# Patient Record
Sex: Male | Born: 1970
Health system: Southern US, Community
[De-identification: ages and names within clinical notes are randomized; demographics above are authoritative.]

## PROBLEM LIST (undated history)

## (undated) DIAGNOSIS — I10 Essential (primary) hypertension: Secondary | ICD-10-CM

---

## 1998-08-24 ENCOUNTER — Emergency Department (HOSPITAL_COMMUNITY): Admission: EM | Admit: 1998-08-24 | Discharge: 1998-08-24 | Payer: Self-pay | Admitting: Emergency Medicine

## 2000-10-02 ENCOUNTER — Encounter: Payer: Self-pay | Admitting: Cardiology

## 2000-10-02 ENCOUNTER — Ambulatory Visit (HOSPITAL_COMMUNITY): Admission: RE | Admit: 2000-10-02 | Discharge: 2000-10-02 | Payer: Self-pay | Admitting: Cardiology

## 2003-01-06 ENCOUNTER — Emergency Department (HOSPITAL_COMMUNITY): Admission: EM | Admit: 2003-01-06 | Discharge: 2003-01-06 | Payer: Self-pay | Admitting: Emergency Medicine

## 2003-01-08 ENCOUNTER — Encounter: Payer: Self-pay | Admitting: Emergency Medicine

## 2003-01-08 ENCOUNTER — Emergency Department (HOSPITAL_COMMUNITY): Admission: EM | Admit: 2003-01-08 | Discharge: 2003-01-08 | Payer: Self-pay | Admitting: Emergency Medicine

## 2003-01-15 ENCOUNTER — Emergency Department (HOSPITAL_COMMUNITY): Admission: EM | Admit: 2003-01-15 | Discharge: 2003-01-15 | Payer: Self-pay | Admitting: Emergency Medicine

## 2003-01-16 ENCOUNTER — Emergency Department (HOSPITAL_COMMUNITY): Admission: EM | Admit: 2003-01-16 | Discharge: 2003-01-16 | Payer: Self-pay | Admitting: *Deleted

## 2003-01-16 ENCOUNTER — Encounter: Payer: Self-pay | Admitting: Emergency Medicine

## 2003-12-28 ENCOUNTER — Emergency Department (HOSPITAL_COMMUNITY): Admission: EM | Admit: 2003-12-28 | Discharge: 2003-12-28 | Payer: Self-pay | Admitting: Family Medicine

## 2005-01-04 ENCOUNTER — Emergency Department (HOSPITAL_COMMUNITY): Admission: EM | Admit: 2005-01-04 | Discharge: 2005-01-04 | Payer: Self-pay | Admitting: Emergency Medicine

## 2005-01-05 ENCOUNTER — Emergency Department (HOSPITAL_COMMUNITY): Admission: EM | Admit: 2005-01-05 | Discharge: 2005-01-05 | Payer: Self-pay | Admitting: Emergency Medicine

## 2005-01-05 ENCOUNTER — Emergency Department (HOSPITAL_COMMUNITY): Admission: EM | Admit: 2005-01-05 | Discharge: 2005-01-06 | Payer: Self-pay | Admitting: Emergency Medicine

## 2005-01-07 ENCOUNTER — Inpatient Hospital Stay (HOSPITAL_COMMUNITY): Admission: EM | Admit: 2005-01-07 | Discharge: 2005-01-08 | Payer: Self-pay | Admitting: Emergency Medicine

## 2005-08-25 ENCOUNTER — Emergency Department (HOSPITAL_COMMUNITY): Admission: EM | Admit: 2005-08-25 | Discharge: 2005-08-25 | Payer: Self-pay | Admitting: *Deleted

## 2006-01-08 ENCOUNTER — Emergency Department (HOSPITAL_COMMUNITY): Admission: EM | Admit: 2006-01-08 | Discharge: 2006-01-08 | Payer: Self-pay | Admitting: Emergency Medicine

## 2007-01-31 ENCOUNTER — Emergency Department (HOSPITAL_COMMUNITY): Admission: EM | Admit: 2007-01-31 | Discharge: 2007-01-31 | Payer: Self-pay | Admitting: Emergency Medicine

## 2007-02-01 ENCOUNTER — Emergency Department (HOSPITAL_COMMUNITY): Admission: EM | Admit: 2007-02-01 | Discharge: 2007-02-01 | Payer: Self-pay | Admitting: Emergency Medicine

## 2009-11-02 ENCOUNTER — Emergency Department (HOSPITAL_COMMUNITY): Admission: EM | Admit: 2009-11-02 | Discharge: 2009-11-02 | Payer: Self-pay | Admitting: Family Medicine

## 2010-09-05 ENCOUNTER — Emergency Department (HOSPITAL_COMMUNITY)
Admission: EM | Admit: 2010-09-05 | Discharge: 2010-09-05 | Disposition: A | Discharge status: Home / Self Care | Payer: Worker's Compensation | Attending: Emergency Medicine | Admitting: Emergency Medicine

## 2010-09-05 DIAGNOSIS — R209 Unspecified disturbances of skin sensation: Secondary | ICD-10-CM | POA: Insufficient documentation

## 2010-09-05 DIAGNOSIS — M545 Low back pain, unspecified: Secondary | ICD-10-CM | POA: Insufficient documentation

## 2010-09-05 DIAGNOSIS — G8929 Other chronic pain: Secondary | ICD-10-CM | POA: Insufficient documentation

## 2010-09-05 DIAGNOSIS — I10 Essential (primary) hypertension: Secondary | ICD-10-CM | POA: Insufficient documentation

## 2010-11-13 NOTE — H&P (Signed)
NAMECHAUNCY, MANGIARACINA                   ACCOUNT NO.:  [id number]   MEDICAL RECORD NO.:  [id number]          PATIENT TYPE:  INP   LOCATION:  1823                         FACILITY:  [facility]   PHYSICIAN:  Marlan Palau, M.D.  DATE OF BIRTH:  11-16-1970   DATE OF ADMISSION:  01/07/2005  DATE OF DISCHARGE:                                HISTORY & PHYSICAL   HISTORY OF PRESENT ILLNESS:  Charles Preston is a 40 year old right-handed  black male born 07-25-1970 with a history of cluster migraine variant  seen previously by Dr. Vickey Huger at some point in the past.  Patient claims  his headaches began in 2000 and then recurred about once a year lasting  about two weeks in a cycle.  Headaches tend to occur on the left side of the  head, around the left eye, go to the left occipital area associated with  droopy eyelid, watery eye and nose.  Patient has severe boring pain in the  left periorbital area that is incapacitating.  Patient claims that his  headaches began about a week ago, may last about four or five hours in  duration and then let up.  Headaches may come on while sleeping in the  middle of the night.  Patient has frequented the emergency room over the  last four days and is unable to function.  Oxygen therapy has not helped.  Patient is being brought in for an evaluation of the above.   PAST MEDICAL HISTORY:  1.  History of cluster migraines as above.  2.  Hypertension, recently placed on hydrochlorothiazide 25 mg a day.   Patient is on no other medications, has no no known allergies.  Smokes about  two packs of cigarettes a week.  Drinks about a six-pack of beer a week.   SOCIAL HISTORY:  This patient is married.  Lives in the [place] area.  Has two children, two sons who are alive and well.  Patient works in the  [employer].   FAMILY HISTORY:  Notable that both parents are still alive and well.  Patient has a paternal grandmother with hypertension.  Has a maternal cousin  with  juvenile diabetes.  No family history of cancer is noted.   REVIEW OF SYSTEMS:  Notable for no recent fevers, chills.  Patient does note  the headache above.  Denies neck stiffness.  Denies chest pain.  Does have  occasional shortness of breath.  Does not nausea.  No vomiting.  Denies any  problems controlling the bowels or the bladder.  Has no numbness on the  face, arms, or legs.  No blackout episodes, syncope.  Has some slight gait  imbalance.   PHYSICAL EXAMINATION:  VITAL SIGNS:  Blood pressure is currently 151/84,  heart rate 101, respiratory rate 18, temperature afebrile.  GENERAL:  This patient is a minimally obese black male who is alert and  cooperative at the time of examination.  HEENT:  Head is atraumatic.  Eyes:  Pupils appear to be relatively equal,  round, and reactive but there is definite  ptosis on the left compared to the  right.  NECK:  Supple.  No carotid bruits noted.  RESPIRATORY:  Clear.  CARDIOVASCULAR:  Regular rate and rhythm.  No obvious murmurs, rubs noted.  EXTREMITIES:  Without significant edema.  ABDOMEN:  Positive bowel sounds.  No organomegaly or tenderness noted.  NEUROLOGIC:  Good pin prick sensation to face.  Full extraocular movements  are intact.  Visual fields are full.  Speech is well enunciated and not  aphasic.  Good strength to facial muscles and the muscles to head turning  and shoulder shrug were noted.  Motor testing revealed 5/5 strength in all  fours.  Good symmetric motor tone is noted throughout.  Sensory testing is  intact to pin prick, soft touch, vibratory sensation throughout.  Patient  has good finger-nose-finger, toe-to-finger bilaterally.  Gait was not  tested.  Deep tendon reflexes are symmetric, normal.  Toes downgoing  bilaterally.   LABORATORIES:  Chest x-ray, EKG are pending at the time of this dictation.  CT of the head was unremarkable.   IMPRESSION:  1.  Cluster headache or cluster migraine variant.  2.   Hypertension.   Patient is suffering severely with current headaches, is incapacitated the  last four days, has basically been in the emergency room most of that time.  Will admit this patient for evaluation and management of the headache   PLAN:  1.  Admission to [facility].  2.  Carotid Doppler study.  3.  DHE45 protocol.  4.  IV methylprednisolone.  5.  Analgesics for pain.  6.  Urine drug screen.  Will follow patient's clinical course while in-      house.  Patient may benefit from Lithium therapy upon discharge.      Fortunately, the cycles of headache tend to be fairly brief, lasting      only a couple weeks.       CKW/MEDQ  D:  01/07/2005  T:  01/07/2005  Job:  [id number]   cc:   Osvaldo Shipper. Spruill, M.D.  [address] [id number]  [place]  [place] [address]  Fax: [phone]

## 2010-11-13 NOTE — Discharge Summary (Signed)
Charles Preston, Charles Preston                   ACCOUNT NO.:  [id number]   MEDICAL RECORD NO.:  [id number]          PATIENT TYPE:  INP   LOCATION:  3041                         FACILITY:  [facility]   PHYSICIAN:  Marlan Palau, M.D.  DATE OF BIRTH:  01/22/1971   DATE OF ADMISSION:  01/07/2005  DATE OF DISCHARGE:  01/08/2005                                 DISCHARGE SUMMARY   ADMITTING DIAGNOSES:  1.  Cluster headache.  2.  Hypertension.   DISCHARGE DIAGNOSES:  1.  Cluster headache.  2.  Hypertension.   PROCEDURES:  1.  CT of the head.  No complications of above procedures noted.  2.  Carotid Doppler study also done.   HISTORY OF PRESENT ILLNESS:  Charles Preston is a 40 year old right-handed black  male born 05/14/1971 with a history of cluster headache and hypertension.  The patient has cycles of headaches that have been going on for about six  years, approximately once a year lasting anywhere from two to four weeks  with the cycle.  Episodes involve headaches around the left eye and  forehead.  Patient has had a severe headache for about a week, has been  frequenting the emergency room without much improvement.  Patient has been  seen by Dr. Vickey Huger in the past.  The patient was brought into the hospital  for DHE-45 protocol for treatment of cluster headache.  CT of the head was  performed and was unremarkable.   PAST MEDICAL HISTORY:  1.  Cluster migraine.  2.  Hypertension.   MEDICATIONS:  Hydrochlorothiazide 25 mg a day.   The patient has no known allergies.  Smokes two packs of cigarettes a week.  Drinks a six-pack of beer a week.   Please refer to history and physical dictation summary for social history,  family history, review of systems, physical examination.   HOSPITAL COURSE:  This patient has done well following admission.  The  patient was started on DHE-45 protocol, given 1 mg of DHE-45 every eight  hours x9 doses with Solu-Medrol 100 mg every eight hours for nine  doses.  Pre medication with Reglan 10 mg prior to the injection of the DHE-45.  Patient was given morphine as needed for pain.  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  No internal carotid artery on the right.  Patient has done  quite well with the DHE-45 protocol with basically pain-free situation by  the morning of the 14th of July 2006.  Patient at this point will be  discharged to home on prednisone dose pack 12-day 10 mg pack and placed on  Lithium 300 mg a day.  Patient will also be discharged on  hydrochlorothiazide 25 mg a day.  Follow up with [facility] in two to three weeks.  Patient will contact our office if he has  any further problems.  At the time of discharge patient is bright, alert,  cooperative, ambulatory.   ADDENDUM:  Blood work studies during this hospitalization revealed  sodium  136, potassium 4.3, chloride 100, CO2 28, glucose 103, BUN 12, creatinine  1.1.  Total bilirubin 0.9, alkaline phosphatase 77, SGOT 50, SGPT 99, total  protein 6.9, albumin 4.1, calcium 9.1.  White count 10.4, hemoglobin 15.4,  hematocrit 45.3, MCV 92.4, platelets 227.       CKW/MEDQ  D:  01/08/2005  T:  01/08/2005  Job:  [id number]

## 2014-01-17 ENCOUNTER — Emergency Department (INDEPENDENT_AMBULATORY_CARE_PROVIDER_SITE_OTHER): Payer: Self-pay

## 2014-01-17 ENCOUNTER — Emergency Department (INDEPENDENT_AMBULATORY_CARE_PROVIDER_SITE_OTHER)
Admission: EM | Admit: 2014-01-17 | Discharge: 2014-01-17 | Disposition: A | Discharge status: Home / Self Care | Payer: Self-pay | Source: Home / Self Care | Attending: Emergency Medicine | Admitting: Emergency Medicine

## 2014-01-17 ENCOUNTER — Encounter (HOSPITAL_COMMUNITY): Payer: Self-pay | Admitting: Emergency Medicine

## 2014-01-17 DIAGNOSIS — S335XXA Sprain of ligaments of lumbar spine, initial encounter: Secondary | ICD-10-CM

## 2014-01-17 DIAGNOSIS — S39012A Strain of muscle, fascia and tendon of lower back, initial encounter: Secondary | ICD-10-CM

## 2014-01-17 MED ORDER — MELOXICAM 15 MG PO TABS: 15.0000 mg | ORAL_TABLET | Freq: Every day | ORAL | Status: DC

## 2014-01-17 MED ORDER — CYCLOBENZAPRINE HCL 10 MG PO TABS: 10.0000 mg | ORAL_TABLET | Freq: Three times a day (TID) | ORAL | Status: DC | PRN

## 2014-01-17 MED ORDER — HYDROCODONE-ACETAMINOPHEN 5-325 MG PO TABS: 1.0000 | ORAL_TABLET | Freq: Four times a day (QID) | ORAL | Status: DC | PRN
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
## 2014-02-05 ENCOUNTER — Encounter (HOSPITAL_COMMUNITY): Payer: Self-pay | Admitting: Family Medicine

## 2014-02-05 ENCOUNTER — Emergency Department (INDEPENDENT_AMBULATORY_CARE_PROVIDER_SITE_OTHER)
Admission: EM | Admit: 2014-02-05 | Discharge: 2014-02-05 | Disposition: A | Discharge status: Home / Self Care | Payer: No Typology Code available for payment source | Source: Home / Self Care | Attending: Family Medicine | Admitting: Family Medicine

## 2014-02-05 DIAGNOSIS — M545 Low back pain, unspecified: Secondary | ICD-10-CM

## 2014-02-05 DIAGNOSIS — S39012D Strain of muscle, fascia and tendon of lower back, subsequent encounter: Secondary | ICD-10-CM

## 2014-02-05 DIAGNOSIS — IMO0002 Reserved for concepts with insufficient information to code with codable children: Secondary | ICD-10-CM

## 2014-02-05 MED ORDER — KETOROLAC TROMETHAMINE 60 MG/2ML IM SOLN
60.0000 mg | Freq: Once | INTRAMUSCULAR | Status: AC
  Administered 2014-02-05: 60 mg via INTRAMUSCULAR

## 2014-02-05 MED ORDER — PREDNISONE 50 MG PO TABS: ORAL_TABLET | ORAL | Status: DC

## 2014-02-05 MED ORDER — TRAMADOL HCL 50 MG PO TABS: 50.0000 mg | ORAL_TABLET | Freq: Every evening | ORAL | Status: DC | PRN

## 2014-02-05 MED ORDER — KETOROLAC TROMETHAMINE 60 MG/2ML IM SOLN
INTRAMUSCULAR | Status: AC
  Filled 2014-02-05: qty 2

## 2014-02-05 MED ORDER — METAXALONE 400 MG PO TABS: 400.0000 mg | ORAL_TABLET | Freq: Three times a day (TID) | ORAL | Status: DC
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
## 2014-02-05 NOTE — ED Notes (Signed)
Pt on phone when called and continued to talk on phone. Advised him to let us know when he was off phone.

## 2014-06-11 ENCOUNTER — Emergency Department (HOSPITAL_COMMUNITY)
Admission: EM | Admit: 2014-06-11 | Discharge: 2014-06-11 | Disposition: A | Discharge status: Home / Self Care | Payer: No Typology Code available for payment source | Source: Home / Self Care

## 2014-06-23 ENCOUNTER — Encounter (HOSPITAL_COMMUNITY): Payer: Self-pay | Admitting: Emergency Medicine

## 2014-06-23 ENCOUNTER — Emergency Department (HOSPITAL_COMMUNITY)
Admission: EM | Admit: 2014-06-23 | Discharge: 2014-06-23 | Disposition: A | Discharge status: Home / Self Care | Payer: No Typology Code available for payment source | Attending: Emergency Medicine | Admitting: Emergency Medicine

## 2014-06-23 DIAGNOSIS — Z791 Long term (current) use of non-steroidal anti-inflammatories (NSAID): Secondary | ICD-10-CM | POA: Insufficient documentation

## 2014-06-23 DIAGNOSIS — Z7952 Long term (current) use of systemic steroids: Secondary | ICD-10-CM | POA: Diagnosis not present

## 2014-06-23 DIAGNOSIS — Z79899 Other long term (current) drug therapy: Secondary | ICD-10-CM | POA: Insufficient documentation

## 2014-06-23 DIAGNOSIS — Z720 Tobacco use: Secondary | ICD-10-CM | POA: Insufficient documentation

## 2014-06-23 DIAGNOSIS — K088 Other specified disorders of teeth and supporting structures: Secondary | ICD-10-CM | POA: Insufficient documentation

## 2014-06-23 DIAGNOSIS — G8929 Other chronic pain: Secondary | ICD-10-CM | POA: Insufficient documentation

## 2014-06-23 DIAGNOSIS — R030 Elevated blood-pressure reading, without diagnosis of hypertension: Secondary | ICD-10-CM | POA: Insufficient documentation

## 2014-06-23 DIAGNOSIS — K029 Dental caries, unspecified: Secondary | ICD-10-CM | POA: Diagnosis not present

## 2014-06-23 DIAGNOSIS — F17200 Nicotine dependence, unspecified, uncomplicated: Secondary | ICD-10-CM

## 2014-06-23 MED ORDER — OXYCODONE-ACETAMINOPHEN 5-325 MG PO TABS: ORAL_TABLET | ORAL | Status: DC

## 2014-06-23 MED ORDER — OXYCODONE-ACETAMINOPHEN 5-325 MG PO TABS
1.0000 | ORAL_TABLET | Freq: Once | ORAL | Status: AC
  Administered 2014-06-23: 1 via ORAL
  Filled 2014-06-23: qty 1

## 2014-06-23 NOTE — Discharge Instructions (Signed)
Please follow with your primary care doctor in the next 5 days for high blood pressure evaluation. If you do not have a primary care doctor, present to urgent care. Reduce salt intake. Seek emergency medical care for unilateral weakness, slurring, change in vision, or chest pain and shortness of breath.  Do not hesitate to return to the emergency room for any new, worsening or concerning symptoms.  Please obtain primary care using resource guide below. But the minute you were seen in the emergency room and that they will need to obtain records for further outpatient management.  Take percocet for breakthrough pain, do not drink alcohol, drive, care for children or do other critical tasks while taking percocet.  Return to the emergency room for fever, change in vision, redness to the face that rapidly spreads towards the eye, nausea or vomiting, difficulty swallowing or shortness of breath.   Apply warm compresses to jaw throughout the day.   Followup with a dentist is very important for ongoing evaluation and management of recurrent dental pain. Return to emergency department for emergent changing or worsening symptoms."  Low-cost dental clinic: Yancey Flemings  at [phone]**  **Nuala Alpha at [phone] [address]**    You may also call [phone]  Dental Assistance If the dentist on-call cannot see you, please use the resources below:   Patients with Medicaid: [facility] [phone] [address]. [address], [phone] [address], [phone]  If unable to pay, or uninsured, contact [facility] [phone]) or [facility] Department [phone] in [place], [phone] in [facility]) to become qualified for the adult dental clinic  Other Low-Cost Community Dental Services: [facility]- [address] Natasha Bence [place], [place], [address]    [phone], Ext. [phone]    2nd and 4th Thursday of the month at 6:30am    10 clients each day by appointment, can  sometimes see walk-in     patients if someone does not show for an appointment [facility]- [address] Ether Griffins [place], [place], [address]    [phone] [facility] [address] [place], [place], [address]    [phone]  [facility] Health Department- [phone] [facility] Health Department- [phone] [facility] [phone]   Emergency Department Resource Guide 1) Find a Doctor and Pay Out of Pocket Although you won't have to find out who is covered by your insurance plan, it is a good idea to ask around and get recommendations. You will then need to call the office and see if the doctor you have chosen will accept you as a new patient and what types of options they offer for patients who are self-pay. Some doctors offer discounts or will set up payment plans for their patients who do not have insurance, but you will need to ask so you aren't surprised when you get to your appointment.  2) Contact Your Local Health Department Not all health departments have doctors that can see patients for sick visits, but many do, so it is worth a call to see if yours does. If you don't know where your local health department is, you can check in your phone book. The CDC also has a tool to help you locate your state's health department, and many state websites also have listings of all of their local health departments.  3) Find a Walk-in Clinic If your illness is not likely to be very severe or complicated, you may want to try a walk  in clinic. These are popping up all over the country in pharmacies, drugstores, and shopping centers. They're usually staffed by nurse practitioners or physician assistants that have been trained to treat common illnesses and complaints. They're usually fairly quick and inexpensive. However, if you have serious medical issues or chronic medical problems, these are probably not your best option.  No Primary Care  Doctor: - Call [facility] at  [phone] - they can help you locate a primary care doctor that  accepts your insurance, provides certain services, etc. - Physician Referral Service- [phone]  Chronic Pain Problems: Organization         Address  Phone   Notes  [facility]  [phone] Patients need to be referred by their primary care doctor.   Medication Assistance: Organization         Address  Phone   Notes  [facility] Medication [facility] [address] [place], [place] [address] [phone] --Must be a resident of [facility] -- Must have NO insurance coverage whatsoever (no Medicaid/ Medicare, etc.) -- The pt. MUST have a primary care doctor that directs their care regularly and follows them in the community   [facility]ist  [phone]   [employer]  [phone]) [phone]    Agencies that provide inexpensive medical care: Organization         Address  Phone   Notes  [facility]  [phone]   [facility]    [phone]   [facility] [address] [place], [place] [address] [phone]) [phone]   [facility] [address], [place] [phone]) [phone]   [facility]anned [facility]    [phone]) [phone]   [facility]    [phone]) [phone]   [facility]  [address], [place] Phone:  [phone], Fax:  [phone] Hours of Operation:  9 am - 6 pm, M-F.  Also accepts Medicaid/Medicare and self-pay.  [facility]  [address], [place] Phone: [phone], Fax: [phone]. Hours of Operation:  8:30 am - 5:30 pm, M-F.  Also accepts Medicaid and self-pay.  [facility] [place] [address], [place] Phone: [phone](336) [phone]   [facility] [address], [place], [place] [phone], Ext. [phone] Mondays & Thursdays: 7-9 AM.  First 15 patients are seen on a first  come, first serve basis.    Medicaid-accepting [facility] Providers:  Organization         Address  Phone   Notes  [facility] [address], [place] [phone] Also accepts self-pay patients.  [facility] [address], [place]  [phone](336) [phone]   [facility] [address], [place] [phone]) [phone]   [facility] [address] [place] [address], [place] [phone]) [phone]   Renaye RakersVeita Bland [address], [place]   [phone] Only accepts [place] Access [place] patients after they have their name applied to their card.   Self-Pay (no insurance) in [facility] [place]:  Organization         Address  Phone   Notes  Sickle Cell Patients, [facility] [address], [place] [phone]   [facility] [address], [place] [phone]   [facility] [place]  [address], [place] [phone]   [facility]/Dr. Osei-Bonsu  [address] [place] [address], [place] or [address], [place] [phone] Phone number for both [place] and [place] locations is the same.  [facility] [address], [place] [phone]   [facility] [place] [address] [place] [address], [place] or [address] [phone]   [facility] [address], [place] [phone]) [phone], phone; [phone](336) [phone], fax Sees patients 1st and 3rd Saturday of every month.  Must not qualify for public or private insurance (i.e. Medicaid, Medicare, [place] Health Choice, Veterans' Benefits)  Household income should be no more than 200% of the poverty level The clinic cannot treat you if you are pregnant or think you are pregnant  Sexually transmitted diseases are not treated at the clinic.    Dental Care: Organization          Address  Phone  Notes  [facility] Department of [facility] [address], [place] [phone] Accepts children up to age 42 who are enrolled in [place] or [place] Health Choice; pregnant women with a Medicaid card; and children who have applied for Medicaid or [place] Health Choice, but were declined, whose parents can pay a reduced fee at time of service.  [facility] Department of [facility] [place]  [address], [place] [phone]) [phone] Accepts children up to age 42 who are enrolled in [place] or [place] Health Choice; pregnant women with a Medicaid card; and children who have applied for Medicaid or  Health Choice, but were declined, whose parents can pay a reduced fee at time of service.  [facility]  [address], [place] [phone]) [phone] Patients are seen by appointment only. Walk-ins are not accepted. [facility] will see patients 42 years of age and older. Monday - Tuesday (8am-5pm) Most Wednesdays (8:30-5pm) $30 per visit, cash only  [facility]  [address], [facility] [phone] Patients are seen by appointment only. Walk-ins are not accepted. [facility] will see patients 42 years of age and older. One Wednesday Evening (Monthly: Volunteer Based).  $30 per visit, cash only  [employer] [facility] [facility]  [phone] for adults; Children under age 144, call Graduate Pediatric Dentistry at [phone]. Children aged 304-14, please call [phone] to request a pediatric application.  Dental services are provided in all areas of dental care including fillings, crowns and bridges, complete and partial dentures, implants, gum treatment, root canals, and extractions. Preventive care is also provided. Treatment is provided to both adults and children. Patients are selected via a lottery and there is often a waiting list.   [facility] [address], [place]  [phone] [email or web address]   [facility] [address], [place], [place] [phone], Ext. [phone] Second and Fourth Thursday of each month, opens at 6:30 AM; Clinic ends at 9 AM.  Patients are seen on a first-come first-served basis, and a limited number are seen during each clinic.   [facility]  [address], [place], [place] [phone]) [phone]   Eligibility Requirements You must have lived in [place], [place], or [place] counties for at least the last three months.   You cannot be eligible for state or federal sponsored National Cityhealthcare insurance,  including [employer], [place], or [employer].   You generally cannot be eligible for healthcare insurance through your employer.    How to apply: Eligibility screenings are held every Tuesday and Wednesday afternoon from 1:00 pm until 4:00 pm. You do not need an appointment for the interview!  [facility] [address], [place], [place] [phone]   [facility] Health Department  [phone]   [facility] Health Department  [phone]   [facility] Health Department  [phone]    Behavioral Health Resources in the Community: Intensive Outpatient Programs Organization         Address  Phone  Notes  [facility] Services [address] [place], [place] [phone]   [facility]patient [address] [place], [place] [phone]   ADS: Alcohol & Drug Svcs [address] [place], [place]  [phone]   [facility] [address],  [place], [place] [phone] or [phone]   Substance Abuse Resources Organization         Address  Phone  Notes  Alcohol and Drug Services  [phone]   [facility]  [phone]   [facility]  [phone]   Floydene Flock  [phone]   Residential & Outpatient Substance Abuse Program  [phone]   Psychological  Services Organization         Address  Phone  Notes  [facility] Health  [phone]   [facility]  [phone]   [facility] [address], [place] [phone] or [phone]    Mobile Crisis Teams Organization         Address  Phone  Notes  [facility], Mobile Crisis Care Unit  [phone]   [facility]  [address] [place], [place] [phone]   Doristine Locks [address] [place] [phone]    Self-Help/Support Groups Organization         Address  Phone             Notes  [facility] [place] - variety of support groups  336- I7437963 Call for more information  Narcotics Anonymous (NA), [facility] [address], [employer] [place]  2 meetings at this location   Statistician         Address  Phone  Notes  [facility] [address],    [place]  [phone]   [facility]  [address] [place] [address], [place], [place] [phone]   [facility] [address] [place], [place] [phone] Admissions: 8am-3pm M-F  [facility] [address],    [place], [place] [phone]   [facility] [address] [place], [place], [place] [phone]   [facility] [address],  [place], [place] [phone]   [facility] - Intensive Outpatient [address], [place], [place] [phone]   [facility] ([facility] [address]  [place], [place] [phone] or [phone]   [facility] ([facility]) [address] [place], [place] [phone] Accepts Medicaid  [facility] [place] [address]  [place] [phone] Substance Abuse/Addiction Treatment   [facility] Organization         Address  Phone  Notes  [facility]  [phone]   Angie Fava, PhD [address] [place], [place]   [phone] or [phone])  [phone]   [facility]   [address] [place], [place] [phone]   [facility] [address] [place], [place] [phone] Insurance/Medicaid/sponsorship through [facility] and Families [address]                                    [place], [place] [phone] Therapy/tele-psych/case  [facility] [address]   [place], [place] [phone]    Dr. Lolly Mustache  [phone]   [facility]  [employer] [facility] Dept. 1) [address], [place] 2) [address], [place] 3)  [address], [place] [phone]  [phone]   [facility] Child Abuse Hotline 404-857-6871 or [phone] (After Hours)

## 2014-06-23 NOTE — ED Provider Notes (Signed)
CSN: [id number]     Arrival date & time 06/23/14  0200 History   First MD Initiated Contact with Patient 06/23/14 0559     Chief Complaint  Patient presents with  . Dental Pain     (Consider location/radiation/quality/duration/timing/severity/associated sxs/prior Treatment) HPI  Charles Preston is a 43 y.o. male complaining of exacerbation of chronic dental pain to the right lower jaw intermittently over the last several weeks. Patient has seen a dentist however he is unable to follow-up with him due to cost. Patient denies fever, chills, swelling, difficulty swallowing. He rates his pain at 8 out of 10, taking over-the-counter pain medications with little relief. He has been taking unknown antibiotic which is green in color and finished the course yesterday. She does not have a formal diagnosis of hypertension, patient denies chest pain, shortness of breath, headache, abdominal pain, nausea vomiting, change in urination. Daily smoker.  History reviewed. No pertinent past medical history. History reviewed. No pertinent past surgical history. No family history on file. History  Substance Use Topics  . Smoking status: Current Every Day Smoker -- 0.10 packs/day  . Smokeless tobacco: Not on file  . Alcohol Use: Yes    Review of Systems  10 systems reviewed and found to be negative, except as noted in the HPI.   Allergies  Review of patient's allergies indicates no known allergies.  Home Medications   Prior to Admission medications   Medication Sig Start Date End Date Taking? Authorizing Provider  meloxicam (MOBIC) 15 MG tablet Take 1 tablet (15 mg total) by mouth daily. 01/17/14   Charm RingsErin J Honig, MD  metaxalone (SKELAXIN) 400 MG tablet Take 1-2 tablets (400-800 mg total) by mouth 3 (three) times daily. 02/05/14   Ozella Rocksavid J Merrell, MD  oxyCODONE-acetaminophen (PERCOCET/ROXICET) 5-325 MG per tablet 1 to 2 tabs PO q6hrs  PRN for pain 06/23/14   [facility] CenterNicole Rena Sweeden, PA-C  predniSONE (DELTASONE)  50 MG tablet Take daily with breakfast 02/05/14   Ozella Rocksavid J Merrell, MD  traMADol (ULTRAM) 50 MG tablet Take 1 tablet (50 mg total) by mouth at bedtime as needed for moderate pain. 02/05/14   Ozella Rocksavid J Merrell, MD   BP 161/93 mmHg  Pulse 89  Temp(Src) 98.1 F (36.7 C) (Oral)  Resp 18  Ht 6' (1.829 m)  Wt 284 lb 8 oz (129.048 kg)  BMI 38.58 kg/m2  SpO2 95% Physical Exam  Constitutional: He is oriented to person, place, and time. He appears well-developed and well-nourished. No distress.  HENT:  Head: Normocephalic.  Mouth/Throat: Oropharynx is clear and moist.  Generally poor dentition, no gingival swelling, erythema or tenderness to palpation. Patient is handling their secretions. There is no tenderness to palpation or firmness underneath tongue bilaterally. No trismus.    Eyes: Conjunctivae and EOM are normal. Pupils are equal, round, and reactive to light.  Neck: Normal range of motion. Neck supple.  No midline C-spine  tenderness to palpation or step-offs appreciated. Patient has full range of motion without pain.   Cardiovascular: Normal rate, regular rhythm and intact distal pulses.   Pulmonary/Chest: Effort normal and breath sounds normal. No stridor. No respiratory distress. He has no wheezes. He has no rales. He exhibits no tenderness.  Abdominal: Soft. Bowel sounds are normal. He exhibits no distension and no mass. There is no tenderness. There is no rebound and no guarding.  Musculoskeletal: Normal range of motion.  Lymphadenopathy:    He has no cervical adenopathy.  Neurological: He is alert and oriented  to person, place, and time.  Follows commands, Clear, goal oriented speech, Strength is 5 out of 5x4 extremities, patient ambulates with a coordinated in nonantalgic gait. Sensation is grossly intact.   Skin: Skin is warm.  Psychiatric: He has a normal mood and affect.  Nursing note and vitals reviewed.   ED Course  Procedures (including critical care time) Labs  Review Labs Reviewed - No data to display  Imaging Review No results found.   EKG Interpretation None      MDM   Final diagnoses:  Pain due to dental caries  Tobacco use disorder  Blood pressure elevated without history of HTN    Filed Vitals:   06/23/14 0210 06/23/14 0212 06/23/14 0545 06/23/14 0600  BP: 177/114  152/90 161/93  Pulse: 94  95 89  Temp: 98.1 F (36.7 C)     TempSrc: Oral     Resp:  18  18  Height: 6' (1.829 m)     Weight: 284 lb 8 oz (129.048 kg)     SpO2: 93%  96% 95%    Medications  oxyCODONE-acetaminophen (PERCOCET/ROXICET) 5-325 MG per tablet 1 tablet (not administered)    Charles Preston is a pleasant 43 y.o. male presenting with exacerbation of chronic dental pain due to dental caries. Patient's blood pressure is also significantly elevated, no formal diagnosis of hypertension, this is asymptomatic with no signs of endorgan damage. Advised patient to quit smoking, reduce salt, follow-up with primary care physician for further management.  Evaluation does not show pathology that would require ongoing emergent intervention or inpatient treatment. Pt is hemodynamically stable and mentating appropriately. Discussed findings and plan with patient/guardian, who agrees with care plan. All questions answered. Return precautions discussed and outpatient follow up given.   New Prescriptions   OXYCODONE-ACETAMINOPHEN (PERCOCET/ROXICET) 5-325 MG PER TABLET    1 to 2 tabs PO q6hrs  PRN for pain         Wynetta Emeryicole Danira Nylander, PA-C 06/23/14 [id number]  Tilden FossaElizabeth Rees, MD 06/23/14 [phone]

## 2014-06-23 NOTE — ED Notes (Signed)
Pt. reports right lower dental pain onset last week .

## 2014-08-02 ENCOUNTER — Other Ambulatory Visit: Payer: Self-pay | Admitting: Cardiology

## 2014-08-02 ENCOUNTER — Ambulatory Visit
Admission: RE | Admit: 2014-08-02 | Discharge: 2014-08-02 | Disposition: A | Discharge status: Home / Self Care | Payer: PRIVATE HEALTH INSURANCE | Source: Ambulatory Visit | Attending: Cardiology | Admitting: Cardiology

## 2014-08-02 DIAGNOSIS — R042 Hemoptysis: Secondary | ICD-10-CM

## 2014-08-15 ENCOUNTER — Encounter (HOSPITAL_COMMUNITY): Payer: Self-pay | Admitting: Emergency Medicine

## 2014-08-15 ENCOUNTER — Emergency Department (INDEPENDENT_AMBULATORY_CARE_PROVIDER_SITE_OTHER)
Admission: EM | Admit: 2014-08-15 | Discharge: 2014-08-15 | Disposition: A | Discharge status: Home / Self Care | Payer: PRIVATE HEALTH INSURANCE | Source: Home / Self Care | Attending: Emergency Medicine | Admitting: Emergency Medicine

## 2014-08-15 DIAGNOSIS — J069 Acute upper respiratory infection, unspecified: Secondary | ICD-10-CM

## 2014-08-15 LAB — POCT RAPID STREP A: STREPTOCOCCUS, GROUP A SCREEN (DIRECT): NEGATIVE

## 2014-08-15 NOTE — ED Notes (Signed)
Pt states that he has been sick for the past 2 to 3 days.  Mw,cma

## 2014-08-15 NOTE — ED Provider Notes (Signed)
CSN: [id number]     Arrival date & time 08/15/14  1332 History   First MD Initiated Contact with Patient 08/15/14 1556     Chief Complaint  Patient presents with  . Sore Throat   (Consider location/radiation/quality/duration/timing/severity/associated sxs/prior Treatment) Patient is a 44 y.o. male presenting with URI.  URI Presenting symptoms: congestion, cough and rhinorrhea   Presenting symptoms: no ear pain, no facial pain, no fatigue and no fever   Severity:  Moderate Onset quality:  Gradual Duration:  1 week Timing:  Constant Progression:  Worsening Chronicity:  New Relieved by:  Nothing Worsened by:  Nothing tried Ineffective treatments:  None tried Associated symptoms: sneezing   Associated symptoms: no arthralgias, no myalgias, no neck pain, no swollen glands and no wheezing     History reviewed. No pertinent past medical history. History reviewed. No pertinent past surgical history. History reviewed. No pertinent family history. History  Substance Use Topics  . Smoking status: Current Every Day Smoker -- 0.10 packs/day  . Smokeless tobacco: Not on file  . Alcohol Use: Yes    Review of Systems  Constitutional: Negative for fever and fatigue.  HENT: Positive for congestion, rhinorrhea and sneezing. Negative for ear pain.   Respiratory: Positive for cough. Negative for wheezing.   Musculoskeletal: Negative for myalgias, arthralgias and neck pain.    Allergies  Review of patient's allergies indicates no known allergies.  Home Medications   Prior to Admission medications   Medication Sig Start Date End Date Taking? Authorizing Provider  meloxicam (MOBIC) 15 MG tablet Take 1 tablet (15 mg total) by mouth daily. 01/17/14   Charm Rings, MD  metaxalone (SKELAXIN) 400 MG tablet Take 1-2 tablets (400-800 mg total) by mouth 3 (three) times daily. 02/05/14   Ozella Rocks, MD  oxyCODONE-acetaminophen (PERCOCET/ROXICET) 5-325 MG per tablet 1 to 2 tabs PO q6hrs  PRN for  pain 06/23/14   [facility], PA-C  predniSONE (DELTASONE) 50 MG tablet Take daily with breakfast 02/05/14   Ozella Rocks, MD  traMADol (ULTRAM) 50 MG tablet Take 1 tablet (50 mg total) by mouth at bedtime as needed for moderate pain. 02/05/14   Ozella Rocks, MD   BP 124/90 mmHg  Pulse 89  Temp(Src) 98.5 F (36.9 C) (Oral)  Resp 16  SpO2 96% Physical Exam  Constitutional: He is oriented to person, place, and time. He appears well-developed and well-nourished. No distress.  HENT:  Mouth/Throat: No oropharyngeal exudate.  Bilateral TMs are normal Oropharynx with moderate amount of clear PND.  Eyes: Conjunctivae and EOM are normal.  Neck: Normal range of motion. Neck supple.  Cardiovascular: Normal rate, regular rhythm and normal heart sounds.   Pulmonary/Chest: Effort normal and breath sounds normal. No respiratory distress. He has no wheezes. He has no rales.  Musculoskeletal: Normal range of motion. He exhibits no edema.  Lymphadenopathy:    He has no cervical adenopathy.  Neurological: He is alert and oriented to person, place, and time.  Skin: Skin is warm and dry. No rash noted.  Psychiatric: He has a normal mood and affect.  Nursing note and vitals reviewed.   ED Course  Procedures (including critical care time) Labs Review Labs Reviewed  POCT RAPID STREP A ([facility] URG CARE ONLY)    Imaging Review No results found.   MDM   1. URI (upper respiratory infection)     Upper Respiratory Infection, Adult Sudafed PE 10 mg every 4 hours as needed for congestion For drainage  may take either Allegra or Claritin or Zyrtec. An alternative for drainage that may cause drowsiness is Chlor-Trimeton Use lots of saline nasal spray Tylenol or ibuprofen for sore throat pain and other aches and pains. Drink plenty of fluids stay well hydrated.  Hayden Rasmussenavid Timohty Renbarger, NP 08/15/14 [phone]

## 2014-08-15 NOTE — ED Notes (Signed)
C/o  Sore throat.  Pain with swallowing.  States "I just feel bad all over".   Denies fever, n/v/d.   No otc meds taken for symptoms.

## 2014-08-15 NOTE — Discharge Instructions (Signed)
Upper Respiratory Infection, Adult Sudafed PE 10 mg every 4 hours as needed for congestion For drainage may take either Allegra or Claritin or Zyrtec. An alternative for drainage that may cause drowsiness is Chlor-Trimeton Use lots of saline nasal spray Tylenol or ibuprofen for sore throat pain and other aches and pains. Drink plenty of fluids stay well hydrated. An upper respiratory infection (URI) is also sometimes known as the common cold. The upper respiratory tract includes the nose, sinuses, throat, trachea, and bronchi. Bronchi are the airways leading to the lungs. Most people improve within 1 week, but symptoms can last up to 2 weeks. A residual cough may last even longer.  CAUSES Many different viruses can infect the tissues lining the upper respiratory tract. The tissues become irritated and inflamed and often become very moist. Mucus production is also common. A cold is contagious. You can easily spread the virus to others by oral contact. This includes kissing, sharing a glass, coughing, or sneezing. Touching your mouth or nose and then touching a surface, which is then touched by another person, can also spread the virus. SYMPTOMS  Symptoms typically develop 1 to 3 days after you come in contact with a cold virus. Symptoms vary from person to person. They may include:  Runny nose.  Sneezing.  Nasal congestion.  Sinus irritation.  Sore throat.  Loss of voice (laryngitis).  Cough.  Fatigue.  Muscle aches.  Loss of appetite.  Headache.  Low-grade fever. DIAGNOSIS  You might diagnose your own cold based on familiar symptoms, since most people get a cold 2 to 3 times a year. Your caregiver can confirm this based on your exam. Most importantly, your caregiver can check that your symptoms are not due to another disease such as strep throat, sinusitis, pneumonia, asthma, or epiglottitis. Blood tests, throat tests, and X-rays are not necessary to diagnose a common cold, but  they may sometimes be helpful in excluding other more serious diseases. Your caregiver will decide if any further tests are required. RISKS AND COMPLICATIONS  You may be at risk for a more severe case of the common cold if you smoke cigarettes, have chronic heart disease (such as heart failure) or lung disease (such as asthma), or if you have a weakened immune system. The very young and very old are also at risk for more serious infections. Bacterial sinusitis, middle ear infections, and bacterial pneumonia can complicate the common cold. The common cold can worsen asthma and chronic obstructive pulmonary disease (COPD). Sometimes, these complications can require emergency medical care and may be life-threatening. PREVENTION  The best way to protect against getting a cold is to practice good hygiene. Avoid oral or hand contact with people with cold symptoms. Wash your hands often if contact occurs. There is no clear evidence that vitamin C, vitamin E, echinacea, or exercise reduces the chance of developing a cold. However, it is always recommended to get plenty of rest and practice good nutrition. TREATMENT  Treatment is directed at relieving symptoms. There is no cure. Antibiotics are not effective, because the infection is caused by a virus, not by bacteria. Treatment may include:  Increased fluid intake. Sports drinks offer valuable electrolytes, sugars, and fluids.  Breathing heated mist or steam (vaporizer or shower).  Eating chicken soup or other clear broths, and maintaining good nutrition.  Getting plenty of rest.  Using gargles or lozenges for comfort.  Controlling fevers with ibuprofen or acetaminophen as directed by your caregiver.  Increasing usage of your  inhaler if you have asthma. Zinc gel and zinc lozenges, taken in the first 24 hours of the common cold, can shorten the duration and lessen the severity of symptoms. Pain medicines may help with fever, muscle aches, and throat  pain. A variety of non-prescription medicines are available to treat congestion and runny nose. Your caregiver can make recommendations and may suggest nasal or lung inhalers for other symptoms.  HOME CARE INSTRUCTIONS   Only take over-the-counter or prescription medicines for pain, discomfort, or fever as directed by your caregiver.  Use a warm mist humidifier or inhale steam from a shower to increase air moisture. This may keep secretions moist and make it easier to breathe.  Drink enough water and fluids to keep your urine clear or pale yellow.  Rest as needed.  Return to work when your temperature has returned to normal or as your caregiver advises. You may need to stay home longer to avoid infecting others. You can also use a face mask and careful hand washing to prevent spread of the virus. SEEK MEDICAL CARE IF:   After the first few days, you feel you are getting worse rather than better.  You need your caregiver's advice about medicines to control symptoms.  You develop chills, worsening shortness of breath, or brown or red sputum. These may be signs of pneumonia.  You develop yellow or brown nasal discharge or pain in the face, especially when you bend forward. These may be signs of sinusitis.  You develop a fever, swollen neck glands, pain with swallowing, or white areas in the back of your throat. These may be signs of strep throat. SEEK IMMEDIATE MEDICAL CARE IF:   You have a fever.  You develop severe or persistent headache, ear pain, sinus pain, or chest pain.  You develop wheezing, a prolonged cough, cough up blood, or have a change in your usual mucus (if you have chronic lung disease).  You develop sore muscles or a stiff neck. Document Released: 12/08/2000 Document Revised: 09/06/2011 Document Reviewed: 09/19/2013 [facility] Patient Information 2015 [employer], [place]. This information is not intended to replace advice given to you by your health care provider. Make sure  you discuss any questions you have with your health care provider.

## 2014-08-17 LAB — CULTURE, GROUP A STREP: Strep A Culture: NEGATIVE

## 2014-08-20 ENCOUNTER — Encounter (HOSPITAL_COMMUNITY): Payer: Self-pay | Admitting: Emergency Medicine

## 2014-08-20 ENCOUNTER — Emergency Department (INDEPENDENT_AMBULATORY_CARE_PROVIDER_SITE_OTHER)
Admission: EM | Admit: 2014-08-20 | Discharge: 2014-08-20 | Disposition: A | Discharge status: Home / Self Care | Payer: PRIVATE HEALTH INSURANCE | Source: Home / Self Care | Attending: Family Medicine | Admitting: Family Medicine

## 2014-08-20 DIAGNOSIS — K088 Other specified disorders of teeth and supporting structures: Secondary | ICD-10-CM

## 2014-08-20 DIAGNOSIS — K0889 Other specified disorders of teeth and supporting structures: Secondary | ICD-10-CM

## 2014-08-20 HISTORY — DX: Essential (primary) hypertension: I10

## 2014-08-20 MED ORDER — AMOXICILLIN 500 MG PO CAPS: 500.0000 mg | ORAL_CAPSULE | Freq: Three times a day (TID) | ORAL | Status: AC

## 2014-08-20 MED ORDER — BUPIVACAINE-EPINEPHRINE (PF) 0.5% -1:200000 IJ SOLN
INTRAMUSCULAR | Status: AC
  Filled 2014-08-20: qty 1.8

## 2014-08-20 MED ORDER — TRAMADOL HCL 50 MG PO TABS: 50.0000 mg | ORAL_TABLET | Freq: Four times a day (QID) | ORAL | Status: AC | PRN

## 2014-08-20 NOTE — Discharge Instructions (Signed)
Thank you for coming in today. Take amoxicillin 3 times daily Take tramadol for severe pain. Do not drive after taking this medication.   Dental Pain A tooth ache may be caused by cavities (tooth decay). Cavities expose the nerve of the tooth to air and hot or cold temperatures. It may come from an infection or abscess (also called a boil or furuncle) around your tooth. It is also often caused by dental caries (tooth decay). This causes the pain you are having. DIAGNOSIS  Your caregiver can diagnose this problem by exam. TREATMENT   If caused by an infection, it may be treated with medications which kill germs (antibiotics) and pain medications as prescribed by your caregiver. Take medications as directed.  Only take over-the-counter or prescription medicines for pain, discomfort, or fever as directed by your caregiver.  Whether the tooth ache today is caused by infection or dental disease, you should see your dentist as soon as possible for further care. SEEK MEDICAL CARE IF: The exam and treatment you received today has been provided on an emergency basis only. This is not a substitute for complete medical or dental care. If your problem worsens or new problems (symptoms) appear, and you are unable to meet with your dentist, call or return to this location. SEEK IMMEDIATE MEDICAL CARE IF:   You have a fever.  You develop redness and swelling of your face, jaw, or neck.  You are unable to open your mouth.  You have severe pain uncontrolled by pain medicine. MAKE SURE YOU:   Understand these instructions.  Will watch your condition.  Will get help right away if you are not doing well or get worse. Document Released: 06/14/2005 Document Revised: 09/06/2011 Document Reviewed: 01/31/2008 [facility] Patient Information 2015 [place], [place]. This information is not intended to replace advice given to you by your health care provider. Make sure you discuss any questions you have with your  health care provider.   [facility]:  [facility] [phone]- [phone] (ext [phone])  [phone]  Please call Dr. Lawrence Marseillesivils office [phone] or cell [phone] [address], [place]  Cost for tooth removal $200 includes exam, Xray, and extraction and follow up visit.  Bring list of current medications with you.   [facility] - [address][phone]  [facility] [phone]  [address]  [facility]  [address], [place], [place], [address]  [phone], Ext. [phone]  2nd and 4th Thursday of the month at 6:30am (Simple extractions only - no wisdom teeth or surgery) First come/First serve -First 10 clients served  [facility] [place] residents only)  [address], [place], [place], [address]  [phone]  [facility]  [phone]  [facility]  [phone]  [facility]  [phone]  Please call [facility] at [phone] to get the details to get your tooth pulled.

## 2014-08-20 NOTE — ED Provider Notes (Signed)
Charles FickleSean L Foglesong is a 44 y.o. male who presents to Urgent Care today for right lower tooth pain present starting today. Patient is mildly and off and on for several months. The pain worsened significantly today. He denies any fevers or chills nausea vomiting or diarrhea. The pain radiates to the face. Indications which have not helped.   Past Medical History  Diagnosis Date  . Hypertension    History reviewed. No pertinent past surgical history. History  Substance Use Topics  . Smoking status: Current Every Day Smoker -- 0.10 packs/day  . Smokeless tobacco: Not on file  . Alcohol Use: Yes   ROS as above Medications: No current facility-administered medications for this encounter.   Current Outpatient Prescriptions  Medication Sig Dispense Refill  . OVER THE COUNTER MEDICATION Does not know name of bp med-prescribed by dr spruill    . amoxicillin (AMOXIL) 500 MG capsule Take 1 capsule (500 mg total) by mouth 3 (three) times daily. 30 capsule 0  . traMADol (ULTRAM) 50 MG tablet Take 1 tablet (50 mg total) by mouth every 6 (six) hours as needed. 15 tablet 0   No Known Allergies   Exam:  BP 143/94 mmHg  Pulse 91  Temp(Src) 98 F (36.7 C) (Oral)  Resp 21  SpO2 96% Gen: Well NAD HEENT: EOMI,  MMM right lower molar with large dental caries eroded to gumline and places. Tender to touch. Mild gumline erythema present. No fluctuant areas visible or palpable  Dental injection: Consent obtained Topical numbing medicine applied to the base of the tooth 1.8 mL of Marcaine and epinephrine were injected into the base of the tooth at the junction of the gum and cheek achieving good anesthesia. Patient tolerated procedure well.   No results found for this or any previous visit (from the past 24 hour(s)). No results found.  Assessment and Plan: 44 y.o. male with dental pain status post injection. Treat with tramadol and amoxicillin. Follow-up with dentist.  Discussed warning signs or  symptoms. Please see discharge instructions. Patient expresses understanding.     Rodolph BongEvan S Calvin Chura, MD 08/20/14 1256

## 2014-08-20 NOTE — ED Notes (Signed)
Bottom, right tooth pain-patient says (#31, per patient).  Reports dentist cannot pull tooth until bp comes down and he cannot tolerate pain.

## 2015-02-14 ENCOUNTER — Encounter (HOSPITAL_COMMUNITY): Payer: Self-pay | Admitting: Emergency Medicine

## 2015-02-14 ENCOUNTER — Emergency Department (HOSPITAL_COMMUNITY)
Admission: EM | Admit: 2015-02-14 | Discharge: 2015-02-14 | Disposition: A | Discharge status: Home / Self Care | Payer: PRIVATE HEALTH INSURANCE | Attending: Emergency Medicine | Admitting: Emergency Medicine

## 2015-02-14 DIAGNOSIS — Z720 Tobacco use: Secondary | ICD-10-CM | POA: Diagnosis not present

## 2015-02-14 DIAGNOSIS — Z792 Long term (current) use of antibiotics: Secondary | ICD-10-CM | POA: Insufficient documentation

## 2015-02-14 DIAGNOSIS — R229 Localized swelling, mass and lump, unspecified: Secondary | ICD-10-CM

## 2015-02-14 DIAGNOSIS — R2242 Localized swelling, mass and lump, left lower limb: Secondary | ICD-10-CM | POA: Diagnosis not present

## 2015-02-14 DIAGNOSIS — I10 Essential (primary) hypertension: Secondary | ICD-10-CM | POA: Insufficient documentation

## 2015-02-14 NOTE — Discharge Instructions (Signed)
Read the information below.  You may return to the Emergency Department at any time for worsening condition or any new symptoms that concern you.  If you develop redness, swelling, pus draining from the wound, or fevers greater than 100.4, return to the ER immediately for a recheck.   °

## 2015-02-14 NOTE — ED Provider Notes (Signed)
CSN: [id number]     Arrival date & time 02/14/15  1118 History  This chart was scribed for non-physician practitioner Trixie Dredge, PA-C, working with Jerelyn Scott, MD by Littie Deeds, ED Scribe. This patient was seen in room TR07C/TR07C and the patient's care was started at 11:30 AM.      Chief Complaint  Patient presents with  . Mass   The history is provided by the patient. No language interpreter was used.   HPI Comments: Charles Preston is a 43 y.o. male who presents to the Emergency Department complaining of a mass to his left inner thigh that started at least 4-5 years ago, but has been increasing in size recently. Patient reports having associated pain around the area with friction. The pain is worse with ambulation. He has not tried any treatments. He called [facility], but was told to come here first.    Past Medical History  Diagnosis Date  . Hypertension    No past surgical history on file. No family history on file. Social History  Substance Use Topics  . Smoking status: Current Every Day Smoker -- 0.10 packs/day  . Smokeless tobacco: Not on file  . Alcohol Use: Yes    Review of Systems  Constitutional: Negative for fever.  Musculoskeletal: Negative for gait problem.  Skin: Negative for color change and wound.       Mass   Allergic/Immunologic: Negative for immunocompromised state.  Hematological: Does not bruise/bleed easily.  Psychiatric/Behavioral: Negative for self-injury.      Allergies  Review of patient's allergies indicates no known allergies.  Home Medications   Prior to Admission medications   Medication Sig Start Date End Date Taking? Authorizing Provider  amoxicillin (AMOXIL) 500 MG capsule Take 1 capsule (500 mg total) by mouth 3 (three) times daily. 08/20/14   Rodolph Bong, MD  OVER THE COUNTER MEDICATION Does not know name of bp med-prescribed by dr spruill    Historical Provider, MD  traMADol (ULTRAM) 50 MG tablet Take 1 tablet (50 mg  total) by mouth every 6 (six) hours as needed. 08/20/14   Rodolph Bong, MD   BP 142/92 mmHg  Pulse 88  Temp(Src) 98.3 F (36.8 C) (Oral)  Resp 16  Ht 6' (1.829 m)  Wt 280 lb (127.007 kg)  BMI 37.97 kg/m2  SpO2 98% Physical Exam  Constitutional: He appears well-developed and well-nourished. No distress.  HENT:  Head: Normocephalic and atraumatic.  Neck: Neck supple.  Pulmonary/Chest: Effort normal.  Musculoskeletal: He exhibits no edema.  Neurological: He is alert. He exhibits normal muscle tone.  Skin: He is not diaphoretic. No erythema.  Soft pedunculated mass on the left inner thigh. No erythema, edema, warmth, or discharge. No induration or fluctuance.  Psychiatric: He has a normal mood and affect. His behavior is normal.  Nursing note and vitals reviewed.   ED Course  Procedures  DIAGNOSTIC STUDIES: Oxygen Saturation is 98% on room air, normal by my interpretation.    COORDINATION OF CARE: 11:41 AM-Discussed treatment plan which includes referral to surgery with patient/guardian at bedside and patient/guardian agreed to plan.    Labs Review Labs Reviewed - No data to display  Imaging Review No results found.    EKG Interpretation None      MDM   Final diagnoses:  Localized skin mass, lump, or swelling    Afebrile, nontoxic patient with soft pedunculated skin mass over inner thigh present x 4-5 years, increasing in size.  No  e/o infection.  No other symptoms.  Pt does need to have this removed and likely sent to pathology.  Referred to [place] [facility], also to dermatology.   D/C home with referrals.  Discussed result, findings, treatment, and follow up  with patient.  Pt given return precautions.  Pt verbalizes understanding and agrees with plan.       I personally performed the services described in this documentation, which was scribed in my presence. The recorded information has been reviewed and is accurate.    Trixie Dredge, PA-C 02/14/15  1215  Jerelyn Scott, MD 02/14/15 1228

## 2015-02-14 NOTE — ED Notes (Signed)
Patient states has a "place on L inner thigh".   Patient states "I think it's a wart, but it's growing".   Patient said he called [facility] to take off, but they told him he had to come to ED first.   Patient complains of pain from the area.

## 2015-03-13 IMAGING — CR DG CHEST 2V
2 series · 2 of 2 positions shown · non-contrast
Comparison: None.

CLINICAL DATA: Fatigue.  Sleep apnea.

EXAM:
CHEST  2 VIEW

[w chest pa]
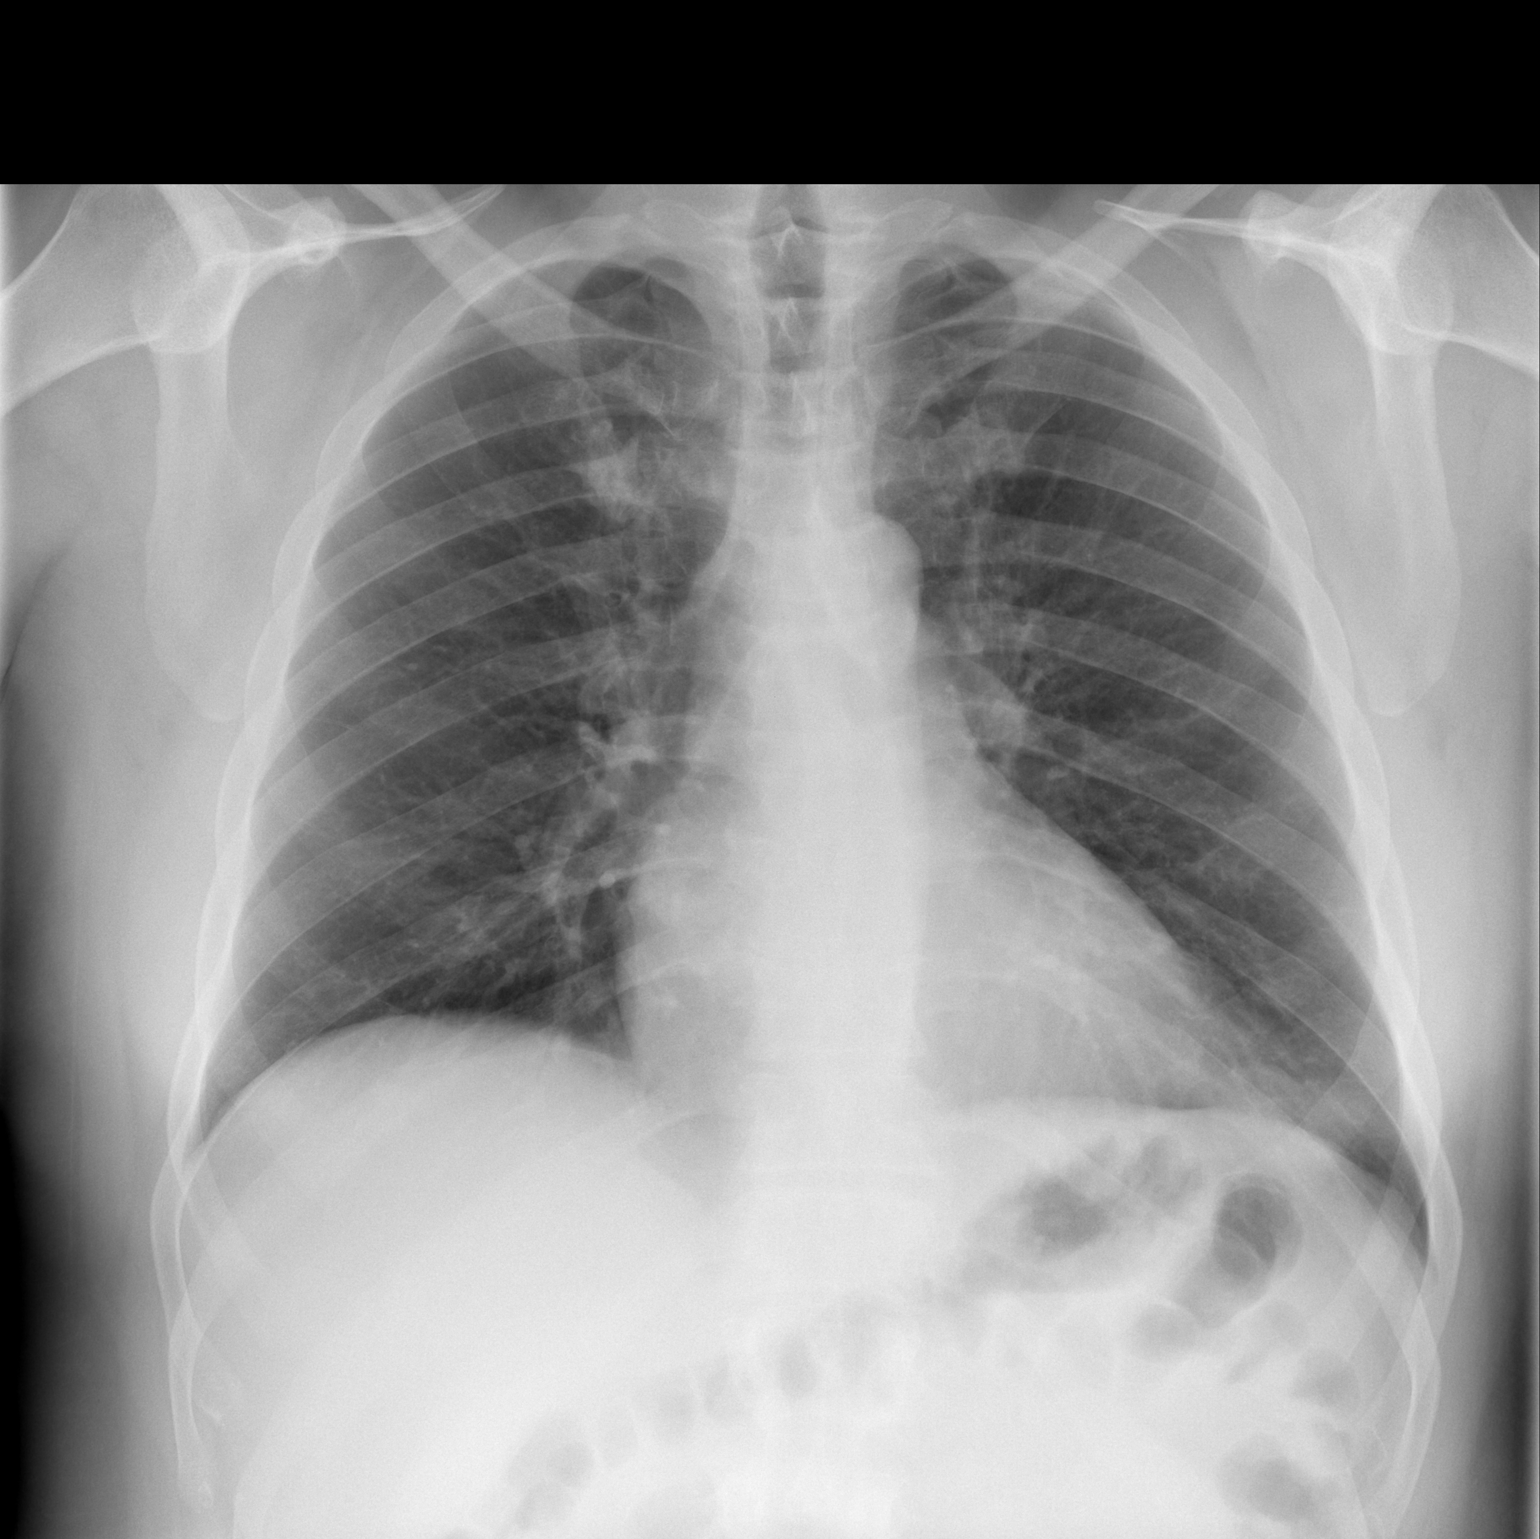

[w chest lat]
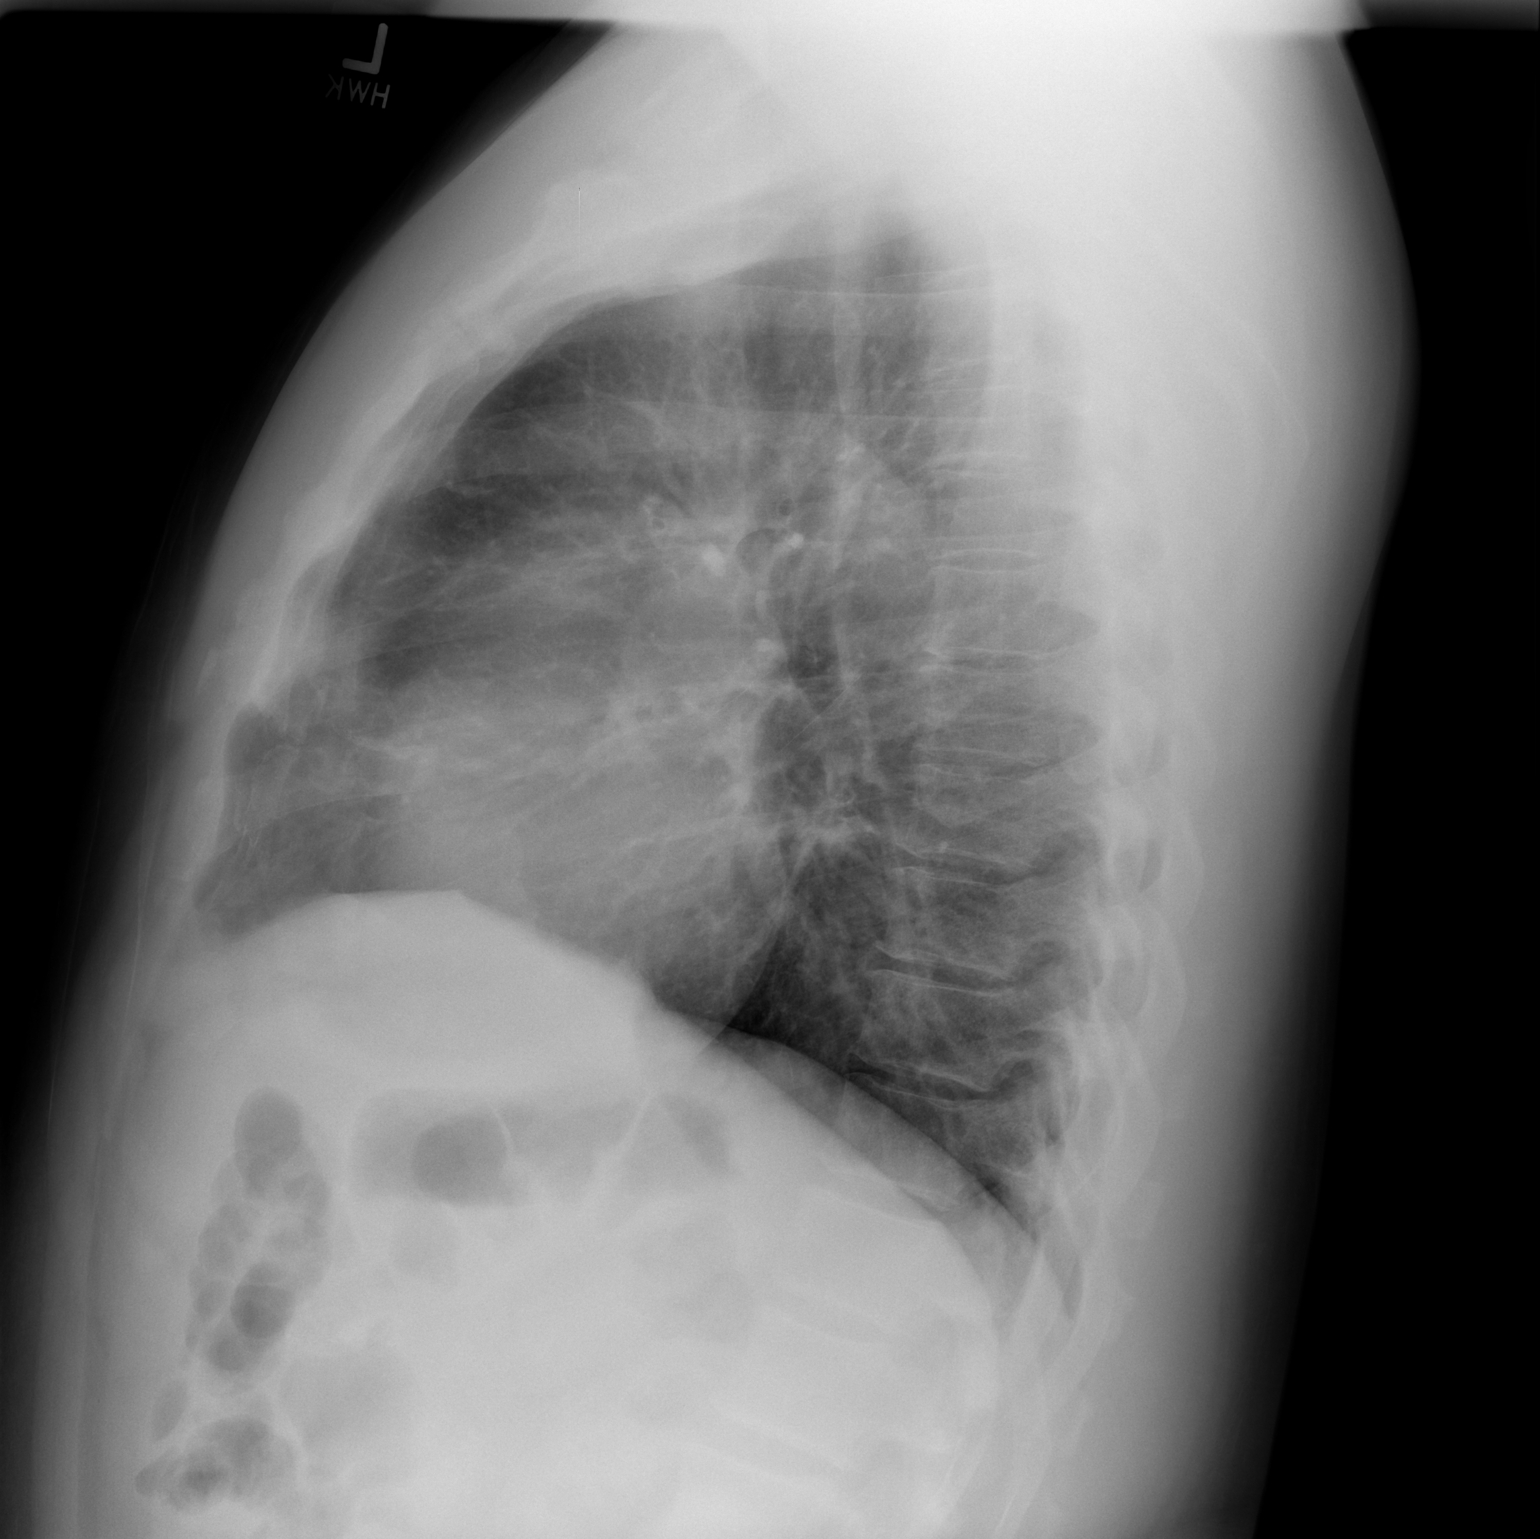

[2 of 2 positions shown; findings below may reference images not displayed]

FINDINGS: Mediastinum and hilar structures are normal. Ill-defined infiltrate
noted in the lingula. Right lung is clear. Cardiomegaly. No acute
bony abnormality .
IMPRESSION: Mild lingular infiltrate consistent with pneumonia.

## 2015-07-08 MED FILL — AMLODIPINE BESYLATE 10 MG T: 10 | 30 days supply | Qty: 30 | Fill #2

## 2015-07-08 MED FILL — LOSARTAN-HCTZ 100-25 MG TAB: 100-25 | 30 days supply | Qty: 30 | Fill #2

## 2015-08-12 MED FILL — AMLODIPINE BESYLATE 10 MG T: 10 | 30 days supply | Qty: 30 | Fill #3

## 2015-08-12 MED FILL — LOSARTAN-HCTZ 100-25 MG TAB: 100-25 | 30 days supply | Qty: 30 | Fill #3

## 2015-11-04 MED FILL — LOSARTAN-HCTZ 100-25 MG TAB: 100-25 | 30 days supply | Qty: 30 | Fill #0

## 2015-11-04 MED FILL — AMLODIPINE BESYLATE 10 MG T: 10 | 30 days supply | Qty: 30 | Fill #0

## 2015-11-13 MED FILL — SULFAMETHOXAZOLE/TMP DS TAB: 800-160 | 10 days supply | Qty: 20 | Fill #0

## 2015-12-08 MED FILL — AMLODIPINE BESYLATE 10 MG T: 10 | 30 days supply | Qty: 30 | Fill #1

## 2015-12-08 MED FILL — LOSARTAN-HCTZ 100-25 MG TAB: 100-25 | 30 days supply | Qty: 30 | Fill #1

## 2016-01-12 MED FILL — LOSARTAN-HCTZ 100-25 MG TAB: 100-25 | 30 days supply | Qty: 30 | Fill #2

## 2016-01-12 MED FILL — AMLODIPINE BESYLATE 10 MG T: 10 | 30 days supply | Qty: 30 | Fill #2

## 2016-02-18 MED FILL — AMLODIPINE BESYLATE 10 MG T: 10 | 30 days supply | Qty: 30 | Fill #3

## 2016-02-18 MED FILL — LOSARTAN-HCTZ 100-25 MG TAB: 100-25 | 30 days supply | Qty: 30 | Fill #3

## 2016-03-26 MED FILL — AMLODIPINE BESYLATE 10 MG T: 10 | 30 days supply | Qty: 30 | Fill #4

## 2016-03-26 MED FILL — LOSARTAN-HCTZ 100-25 MG TAB: 100-25 | 30 days supply | Qty: 30 | Fill #4

## 2016-04-03 ENCOUNTER — Encounter (HOSPITAL_COMMUNITY): Payer: Self-pay | Admitting: *Deleted

## 2016-04-03 ENCOUNTER — Emergency Department (HOSPITAL_COMMUNITY)
Admission: EM | Admit: 2016-04-03 | Discharge: 2016-04-04 | Discharge status: Against Medical Advice | Payer: No Typology Code available for payment source | Attending: Emergency Medicine | Admitting: Emergency Medicine

## 2016-04-03 DIAGNOSIS — S0083XA Contusion of other part of head, initial encounter: Secondary | ICD-10-CM

## 2016-04-03 DIAGNOSIS — S0990XA Unspecified injury of head, initial encounter: Secondary | ICD-10-CM | POA: Diagnosis present

## 2016-04-03 DIAGNOSIS — Y929 Unspecified place or not applicable: Secondary | ICD-10-CM | POA: Diagnosis not present

## 2016-04-03 DIAGNOSIS — I10 Essential (primary) hypertension: Secondary | ICD-10-CM | POA: Insufficient documentation

## 2016-04-03 DIAGNOSIS — S0181XA Laceration without foreign body of other part of head, initial encounter: Secondary | ICD-10-CM | POA: Diagnosis not present

## 2016-04-03 DIAGNOSIS — F17210 Nicotine dependence, cigarettes, uncomplicated: Secondary | ICD-10-CM | POA: Insufficient documentation

## 2016-04-03 DIAGNOSIS — F10129 Alcohol abuse with intoxication, unspecified: Secondary | ICD-10-CM | POA: Insufficient documentation

## 2016-04-03 DIAGNOSIS — Y939 Activity, unspecified: Secondary | ICD-10-CM | POA: Insufficient documentation

## 2016-04-03 DIAGNOSIS — Y990 Civilian activity done for income or pay: Secondary | ICD-10-CM | POA: Diagnosis not present

## 2016-04-03 DIAGNOSIS — F10920 Alcohol use, unspecified with intoxication, uncomplicated: Secondary | ICD-10-CM
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
## 2016-04-04 ENCOUNTER — Emergency Department (HOSPITAL_COMMUNITY): Payer: No Typology Code available for payment source

## 2016-04-04 LAB — I-STAT CHEM 8, ED
BUN: 10 mg/dL (ref 6–20)
CHLORIDE: 100 mmol/L — AB (ref 101–111)
Calcium, Ion: 1.04 mmol/L — ABNORMAL LOW (ref 1.15–1.40)
Creatinine, Ser: 1.3 mg/dL — ABNORMAL HIGH (ref 0.61–1.24)
GLUCOSE: 98 mg/dL (ref 65–99)
HCT: 43 % (ref 39.0–52.0)
Hemoglobin: 14.6 g/dL (ref 13.0–17.0)
Potassium: 3.5 mmol/L (ref 3.5–5.1)
SODIUM: 137 mmol/L (ref 135–145)
TCO2: 23 mmol/L (ref 0–100)

## 2016-04-04 LAB — CBC WITH DIFFERENTIAL/PLATELET
BASOS PCT: 0 %
Basophils Absolute: 0 10*3/uL (ref 0.0–0.1)
EOS ABS: 0.2 10*3/uL (ref 0.0–0.7)
EOS PCT: 2 %
HCT: 40.7 % (ref 39.0–52.0)
Hemoglobin: 14.1 g/dL (ref 13.0–17.0)
LYMPHS ABS: 4 10*3/uL (ref 0.7–4.0)
Lymphocytes Relative: 40 %
MCH: 32.4 pg (ref 26.0–34.0)
MCHC: 34.6 g/dL (ref 30.0–36.0)
MCV: 93.6 fL (ref 78.0–100.0)
MONO ABS: 0.6 10*3/uL (ref 0.1–1.0)
MONOS PCT: 7 %
Neutro Abs: 5 10*3/uL (ref 1.7–7.7)
Neutrophils Relative %: 51 %
PLATELETS: 202 10*3/uL (ref 150–400)
RBC: 4.35 MIL/uL (ref 4.22–5.81)
RDW: 13 % (ref 11.5–15.5)
WBC: 9.8 10*3/uL (ref 4.0–10.5)

## 2016-04-04 LAB — BASIC METABOLIC PANEL
Anion gap: 9 (ref 5–15)
BUN: 10 mg/dL (ref 6–20)
CALCIUM: 8.4 mg/dL — AB (ref 8.9–10.3)
CO2: 21 mmol/L — ABNORMAL LOW (ref 22–32)
CREATININE: 0.99 mg/dL (ref 0.61–1.24)
Chloride: 101 mmol/L (ref 101–111)
Glucose, Bld: 100 mg/dL — ABNORMAL HIGH (ref 65–99)
Potassium: 3.3 mmol/L — ABNORMAL LOW (ref 3.5–5.1)
SODIUM: 131 mmol/L — AB (ref 135–145)

## 2016-04-04 LAB — ETHANOL: ALCOHOL ETHYL (B): 284 mg/dL — AB (ref <5)

## 2016-04-04 NOTE — ED Notes (Signed)
Patient transported to X-ray 

## 2016-04-04 NOTE — ED Notes (Signed)
Pt departed AMA stating he "had work in the morning" and refused to stay for further evaluation or treatment. Pt departed in NAD, refused use of wheelchair.
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
## 2016-04-08 ENCOUNTER — Emergency Department (HOSPITAL_COMMUNITY)
Admission: EM | Admit: 2016-04-08 | Discharge: 2016-04-08 | Disposition: A | Discharge status: Home / Self Care | Payer: No Typology Code available for payment source | Attending: Emergency Medicine | Admitting: Emergency Medicine

## 2016-04-08 ENCOUNTER — Encounter (HOSPITAL_COMMUNITY): Payer: Self-pay | Admitting: Emergency Medicine

## 2016-04-08 ENCOUNTER — Emergency Department (HOSPITAL_COMMUNITY): Payer: No Typology Code available for payment source

## 2016-04-08 DIAGNOSIS — M542 Cervicalgia: Secondary | ICD-10-CM | POA: Diagnosis not present

## 2016-04-08 DIAGNOSIS — M5489 Other dorsalgia: Secondary | ICD-10-CM

## 2016-04-08 DIAGNOSIS — I10 Essential (primary) hypertension: Secondary | ICD-10-CM | POA: Diagnosis not present

## 2016-04-08 DIAGNOSIS — F17210 Nicotine dependence, cigarettes, uncomplicated: Secondary | ICD-10-CM | POA: Insufficient documentation

## 2016-04-08 DIAGNOSIS — M545 Low back pain: Secondary | ICD-10-CM | POA: Diagnosis not present

## 2016-04-08 DIAGNOSIS — M546 Pain in thoracic spine: Secondary | ICD-10-CM | POA: Insufficient documentation

## 2016-04-08 DIAGNOSIS — W19XXXA Unspecified fall, initial encounter: Secondary | ICD-10-CM

## 2016-04-08 MED ORDER — METHOCARBAMOL 500 MG PO TABS: 500.0000 mg | ORAL_TABLET | Freq: Two times a day (BID) | ORAL | 0 refills | Status: AC

## 2016-04-08 MED ORDER — NAPROXEN 500 MG PO TABS: 500.0000 mg | ORAL_TABLET | Freq: Two times a day (BID) | ORAL | 0 refills | Status: AC

## 2016-04-08 NOTE — ED Notes (Signed)
Pt denies concerns with dc 
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
## 2016-04-08 NOTE — ED Provider Notes (Signed)
[facility] Provider Note   CSN: [id number] Arrival date & time: 04/08/16  1555  By signing my name below, I, Emmanuella Mensah, attest that this documentation has been prepared under the direction and in the presence of Felicie Morn, NP. Electronically Signed: Angelene Giovanni, ED Scribe. 04/08/16. 4:53 PM.   History   Chief Complaint Chief Complaint  Patient presents with  . Fall    HPI Comments: Charles Preston is a 45 y.o. male with a hx of hypertension who presents to the Emergency Department complaining of gradually worsening moderate neck pain with stiffness s/p witnessed fall that occurred 5 days ago while at work. He reports associated moderate bilateral shoulder pain and lower back pain. No alleviating factors noted. He states that he tried icy hot and 500 mg Tylenol yesterday with no relief. 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 He presented with a laceration to his forehead. He adds that witnesses stated that he was shaking prior to falling. He had normal CT cervical spine, CT head, and chest x-ray at that time however pt left AMA after being confronted for his elevated ETOH level. He denies any fever, chills, headaches, or any other symptoms.   No current PCP  The history is provided by the patient. No language interpreter was used.    Past Medical History:  Diagnosis Date  . Hypertension     There are no active problems to display for this patient.   History reviewed. No pertinent surgical history.     Home Medications    Prior to Admission medications   Medication Sig Start Date End Date Taking? Authorizing Provider  amoxicillin (AMOXIL) 500 MG capsule Take 1 capsule (500 mg total) by mouth 3 (three) times daily. Patient not taking: Reported on 04/04/2016 08/20/14   Rodolph Bong, MD  traMADol (ULTRAM) 50 MG tablet Take 1 tablet (50 mg total) by mouth every 6  (six) hours as needed. Patient not taking: Reported on 04/04/2016 08/20/14   Rodolph Bong, MD    Family History No family history on file.  Social History Social History  Substance Use Topics  . Smoking status: Current Every Day Smoker    Packs/day: 0.50    Types: Cigarettes  . Smokeless tobacco: Never Used  . Alcohol use Yes     Comment: socially     Allergies   Review of patient's allergies indicates no known allergies.   Review of Systems Review of Systems  Constitutional: Negative for fever.  Musculoskeletal: Positive for back pain, neck pain and neck stiffness.  Neurological: Negative for headaches.  All other systems reviewed and are negative.    Physical Exam Updated Vital Signs BP 129/69 (BP Location: Right Arm)   Pulse 61   Temp 98.2 F (36.8 C) (Oral)   Resp 16   Ht 6' (1.829 m)   Wt 290 lb (131.5 kg)   SpO2 97%   BMI 39.33 kg/m   Physical Exam  Constitutional: He is oriented to person, place, and time. He appears well-developed and well-nourished. No distress.  HENT:  Head: Normocephalic and atraumatic.  Eyes: Conjunctivae and EOM are normal.  Neck: Neck supple. No tracheal deviation present.  Cardiovascular: Normal rate.   Pulmonary/Chest: Effort normal. No respiratory distress.  Musculoskeletal: He exhibits tenderness.  Midline thoracic and lumbar tenderness  Not moving neck due to discomfort   Neurological: He  is alert and oriented to person, place, and time.  Skin: Skin is warm and dry.  Psychiatric: He has a normal mood and affect. His behavior is normal.  Nursing note and vitals reviewed.    ED Treatments / Results  DIAGNOSTIC STUDIES: Oxygen Saturation is 97% on RA, adequate by my interpretation.    COORDINATION OF CARE: 4:50 PM- Pt advised of plan for treatment and pt agrees. Pt will receive thoracic and lumbar spine x-rays for further evaluation.    Labs (all labs ordered are listed, but only abnormal results are  displayed) Labs Reviewed - No data to display  EKG  EKG Interpretation None       Radiology Dg Thoracic Spine W/swimmers  Result Date: 04/08/2016 CLINICAL DATA:  Status post fall last Saturday. EXAM: THORACIC SPINE - 3 VIEWS COMPARISON:  None. FINDINGS: The vertebral body heights are maintained. The alignment is anatomic. There is no acute fracture or static listhesis. The disc spaces are maintained. The visualized portions of the lungs are clear. IMPRESSION: No acute osseous injury of the thoracic spine. Electronically Signed   By: Elige KoHetal  Patel   On: 04/08/2016 17:36   Dg Lumbar Spine Complete  Result Date: 04/08/2016 CLINICAL DATA:  Status post fall.  Low back pain. EXAM: LUMBAR SPINE - COMPLETE 4+ VIEW COMPARISON:  01/17/2014 FINDINGS: There are 5 nonrib bearing lumbar-type vertebral bodies. The vertebral body heights are maintained. The alignment is anatomic. There is no static listhesis. There is no spondylolysis. There is no acute fracture. There is degenerative disc disease with disc height loss at L2-3. The SI joints are unremarkable. IMPRESSION: No acute osseous injury of the lumbar spine. Electronically Signed   By: Elige KoHetal  Patel   On: 04/08/2016 17:40    Procedures Procedures (including critical care time)  Medications Ordered in ED Medications - No data to display   Initial Impression / Assessment and Plan / ED Course  Felicie Mornavid Pearle Wandler, NP has reviewed the triage vital signs and the nursing notes.  Pertinent labs & imaging results that were available during my care of the patient were reviewed by me and considered in my medical decision making (see chart for details).  Clinical Course   Patient with back pain.  No neurological deficits and normal neuro exam.  Patient is ambulatory.  No loss of bowel or bladder control.  No concern for cauda equina.  No fever, night sweats, weight loss, h/o cancer, IVDA, no recent procedure to back. No urinary symptoms suggestive of UTI.   Supportive care and return precaution discussed. Appears safe for discharge at this time. Follow up as indicated in discharge paperwork.    Final Clinical Impressions(s) / ED Diagnoses   Final diagnoses:  Musculoskeletal neck pain  Other back pain, unspecified chronicity    New Prescriptions New Prescriptions   METHOCARBAMOL (ROBAXIN) 500 MG TABLET    Take 1 tablet (500 mg total) by mouth 2 (two) times daily.   NAPROXEN (NAPROSYN) 500 MG TABLET    Take 1 tablet (500 mg total) by mouth 2 (two) times daily.   I personally performed the services described in this documentation, which was scribed in my presence. The recorded information has been reviewed and is accurate.     Felicie Mornavid Gevon Markus, NP 04/08/16 1756    Eber HongBrian Miller, MD 04/09/16 [phone]

## 2016-04-08 NOTE — ED Notes (Signed)
See np assessment 

## 2016-04-28 MED FILL — AMLODIPINE BESYLATE 10 MG T: 10 | 30 days supply | Qty: 30 | Fill #5

## 2016-05-03 MED FILL — LOSARTAN-HCTZ 100-25 MG TAB: 100-25 | 30 days supply | Qty: 30 | Fill #0

## 2016-06-07 MED FILL — LOSARTAN-HCTZ 100-25 MG TAB: 100-25 | 30 days supply | Qty: 30 | Fill #1

## 2016-06-07 MED FILL — AMLODIPINE BESYLATE 10 MG T: 10 | 30 days supply | Qty: 30 | Fill #6

## 2016-07-16 ENCOUNTER — Emergency Department (HOSPITAL_COMMUNITY)
Admission: EM | Admit: 2016-07-16 | Discharge: 2016-07-29 | Disposition: E | Payer: No Typology Code available for payment source | Attending: Emergency Medicine | Admitting: Emergency Medicine

## 2016-07-16 ENCOUNTER — Encounter (HOSPITAL_COMMUNITY): Payer: Self-pay | Admitting: Emergency Medicine

## 2016-07-16 DIAGNOSIS — R69 Illness, unspecified: Secondary | ICD-10-CM | POA: Diagnosis present

## 2016-07-16 DIAGNOSIS — I469 Cardiac arrest, cause unspecified: Secondary | ICD-10-CM | POA: Diagnosis not present

## 2016-07-16 LAB — PREPARE FRESH FROZEN PLASMA
UNIT DIVISION: 0
Unit division: 0

## 2016-07-16 MED ORDER — EPINEPHRINE PF 1 MG/10ML IJ SOSY
PREFILLED_SYRINGE | INTRAMUSCULAR | Status: DC | PRN
  Administered 2016-07-16: 1 via INTRAVENOUS

## 2016-07-16 MED ORDER — SODIUM CHLORIDE 0.9 % IV SOLN
INTRAVENOUS | Status: DC | PRN
  Administered 2016-07-16: 500 mL via INTRAVENOUS

## 2016-07-16 NOTE — Code Documentation (Signed)
Family updated as to patient's status.
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
## 2016-07-16 NOTE — Code Documentation (Signed)
Patient time of death occurred at 21:24. 

## 2016-07-17 ENCOUNTER — Encounter (HOSPITAL_COMMUNITY): Payer: Self-pay | Admitting: Emergency Medicine
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
## 2016-07-29 NOTE — Progress Notes (Signed)
Chaplain provided support to family, compiling them all to the consult room.  Family extremely emotional.  Provided support and hospitality to family.

## 2016-07-29 DEATH — deceased
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
